# Patient Record
Sex: Female | Born: 1965 | Race: White | Hispanic: No | Marital: Married | Smoking: Former smoker
Health system: Southern US, Community
[De-identification: ages and names within clinical notes are randomized; demographics above are authoritative.]

## PROBLEM LIST (undated history)

## (undated) DIAGNOSIS — K219 Gastro-esophageal reflux disease without esophagitis: Secondary | ICD-10-CM

## (undated) DIAGNOSIS — M419 Scoliosis, unspecified: Secondary | ICD-10-CM

## (undated) DIAGNOSIS — Z8619 Personal history of other infectious and parasitic diseases: Secondary | ICD-10-CM

## (undated) DIAGNOSIS — T7840XA Allergy, unspecified, initial encounter: Secondary | ICD-10-CM

## (undated) HISTORY — DX: Scoliosis, unspecified: M41.9

## (undated) HISTORY — DX: Personal history of other infectious and parasitic diseases: Z86.19

## (undated) HISTORY — DX: Allergy, unspecified, initial encounter: T78.40XA

## (undated) HISTORY — DX: Gastro-esophageal reflux disease without esophagitis: K21.9

## (undated) HISTORY — PX: BACK SURGERY: SHX140

---

## 2003-08-14 ENCOUNTER — Ambulatory Visit (HOSPITAL_COMMUNITY): Admission: RE | Admit: 2003-08-14 | Discharge: 2003-08-14 | Payer: Self-pay | Admitting: Gastroenterology

## 2003-08-14 ENCOUNTER — Encounter: Payer: Self-pay | Admitting: Gastroenterology

## 2003-08-17 ENCOUNTER — Ambulatory Visit (HOSPITAL_COMMUNITY): Admission: RE | Admit: 2003-08-17 | Discharge: 2003-08-17 | Payer: Self-pay | Admitting: Gastroenterology

## 2003-08-17 ENCOUNTER — Encounter (INDEPENDENT_AMBULATORY_CARE_PROVIDER_SITE_OTHER): Payer: Self-pay | Admitting: Specialist

## 2010-01-21 ENCOUNTER — Emergency Department: Payer: Self-pay | Admitting: Emergency Medicine

## 2010-02-07 ENCOUNTER — Encounter: Admission: RE | Admit: 2010-02-07 | Discharge: 2010-02-07 | Payer: Self-pay | Admitting: Gastroenterology

## 2010-08-15 ENCOUNTER — Encounter: Admission: RE | Admit: 2010-08-15 | Discharge: 2010-08-15 | Payer: Self-pay | Admitting: Gastroenterology

## 2010-11-24 LAB — HM MAMMOGRAPHY

## 2011-04-11 NOTE — Op Note (Signed)
   [name], Kathryn York                            ACCOUNT NO.:  [id number]   MEDICAL RECORD NO.:  [id number]                   PATIENT TYPE:  AMB   LOCATION:  ENDO                                 FACILITY:  [facility]   PHYSICIAN:  Bernette Redbird, M.D.                DATE OF BIRTH:  [birth year]   DATE OF PROCEDURE:  08/17/2003  DATE OF DISCHARGE:                                 OPERATIVE REPORT   PROCEDURE:  Upper endoscopy.   INDICATIONS:  A 45 year old female with unexplained persistent recurring  upper abdominal pain with negative ultrasound, negative hepatobiliary scan  of gallbladder and no response to PPI therapy.   FINDINGS:  Normal exam.   CONSENT:  The nature, purpose and risks of this procedure had been discussed  with the patient who provided written consent.   SEDATION:  Fentanyl 75 mcg and Versed 6 mg IV without arrhythmias or  desaturation.   DESCRIPTION OF PROCEDURE:  The Olympus small caliber adult video endoscope  was passed under direct vision.  The vocal cords looked normal.  The  esophagus was readily entered.  The distal esophagus was normal.  There was  no free reflux, reflux esophagitis, Barrett's esophagus, varices infection,  neoplasia, or any ring, stricture or hiatal hernia appreciated.   The stomach contained no significant residual and had entirely normal mucosa  without evidence of gastritis, erosions, ulcers, polyps, or masses including  a retroflexed view of the proximal stomach.  The pylorus, duodenal bulb and  second duodenum looked normal.  In particular, there was no evidence of  peptic deformity.   Biopsies were obtained randomly from the duodenum and gastric antrum prior  to removal of the scope.  The patient tolerated the procedure well and there  were no apparent complications.   IMPRESSION:  1. Normal upper endoscopy.  2. No source of pain identified.   PLAN:  Await pathology results with followup in the office in the near   future.                                                Bernette Redbird, M.D.    RB/MEDQ  D:  08/17/2003  T:  08/17/2003  Job:  [id number]   cc:   Maurine Simmering, M.D.

## 2011-12-11 ENCOUNTER — Ambulatory Visit (INDEPENDENT_AMBULATORY_CARE_PROVIDER_SITE_OTHER): Payer: BC Managed Care – PPO | Admitting: Family Medicine

## 2011-12-11 ENCOUNTER — Encounter: Payer: Self-pay | Admitting: Family Medicine

## 2011-12-11 VITALS — BP 102/80 | HR 82 | Temp 98.4°F | Ht 64.5 in | Wt 135.0 lb

## 2011-12-11 DIAGNOSIS — Z833 Family history of diabetes mellitus: Secondary | ICD-10-CM

## 2011-12-11 DIAGNOSIS — M419 Scoliosis, unspecified: Secondary | ICD-10-CM

## 2011-12-11 DIAGNOSIS — Z8249 Family history of ischemic heart disease and other diseases of the circulatory system: Secondary | ICD-10-CM

## 2011-12-11 DIAGNOSIS — M4120 Other idiopathic scoliosis, site unspecified: Secondary | ICD-10-CM

## 2011-12-11 DIAGNOSIS — K219 Gastro-esophageal reflux disease without esophagitis: Secondary | ICD-10-CM

## 2011-12-11 NOTE — Patient Instructions (Signed)
I would think about getting a flu shot.   Don't change your meds. I would avoid ibuprofen, aleve or aspirin.  Tylenol is okay to take.   Recheck sugar and cholesterol this fall.  Come see me after that.   Keep exercising.

## 2011-12-12 ENCOUNTER — Encounter: Payer: Self-pay | Admitting: Family Medicine

## 2011-12-12 DIAGNOSIS — Z833 Family history of diabetes mellitus: Secondary | ICD-10-CM | POA: Insufficient documentation

## 2011-12-12 DIAGNOSIS — M419 Scoliosis, unspecified: Secondary | ICD-10-CM | POA: Insufficient documentation

## 2011-12-12 DIAGNOSIS — K219 Gastro-esophageal reflux disease without esophagitis: Secondary | ICD-10-CM | POA: Insufficient documentation

## 2011-12-12 NOTE — Assessment & Plan Note (Addendum)
Continue PPI, elevated head of bed. >30 min spent with face to face with patient, >50% counseling and/or coordinating care

## 2011-12-12 NOTE — Assessment & Plan Note (Signed)
Requesting records, flu shot declined.  Diet/exercise d/w pt.  Recheck sugar later in 2013.

## 2011-12-12 NOTE — Assessment & Plan Note (Signed)
Continue PT exercises at home

## 2011-12-12 NOTE — Progress Notes (Signed)
New patient, to est care.   GERD with LPR sx (pharyngeal irritation) s/p EGD per [facility].  On PPI.  D/w pt about head of bed elevation, hasn't done that yet.  No vomiting, no hematochezia.  Chest pain prev improved with PPI.    Scoliosis.  Prev in PT.  Back pain improved with that.  No h/o surgery.    FH DM.  Prev with one "borderline" sugar.  Requesting records.    PMH and SH reviewed  ROS: See HPI, otherwise noncontributory.  Meds, vitals, and allergies reviewed.   GEN: nad, alert and oriented HEENT: mucous membranes moist NECK: supple w/o LA CV: rrr. PULM: ctab, no inc wob ABD: soft, +bs EXT: no edema SKIN: no acute rash Back with scoliotic changes noted

## 2011-12-29 ENCOUNTER — Encounter: Payer: Self-pay | Admitting: Family Medicine

## 2011-12-29 DIAGNOSIS — Z8659 Personal history of other mental and behavioral disorders: Secondary | ICD-10-CM | POA: Insufficient documentation

## 2013-08-10 ENCOUNTER — Encounter (INDEPENDENT_AMBULATORY_CARE_PROVIDER_SITE_OTHER): Payer: BC Managed Care – PPO | Admitting: Ophthalmology

## 2013-08-10 DIAGNOSIS — H2510 Age-related nuclear cataract, unspecified eye: Secondary | ICD-10-CM

## 2013-08-10 DIAGNOSIS — H43819 Vitreous degeneration, unspecified eye: Secondary | ICD-10-CM

## 2013-08-10 DIAGNOSIS — H3530 Unspecified macular degeneration: Secondary | ICD-10-CM

## 2013-08-24 ENCOUNTER — Ambulatory Visit (INDEPENDENT_AMBULATORY_CARE_PROVIDER_SITE_OTHER)
Admission: RE | Admit: 2013-08-24 | Discharge: 2013-08-24 | Disposition: A | Discharge status: Home / Self Care | Payer: BC Managed Care – PPO | Source: Ambulatory Visit | Attending: Family Medicine | Admitting: Family Medicine

## 2013-08-24 ENCOUNTER — Encounter: Payer: Self-pay | Admitting: Family Medicine

## 2013-08-24 ENCOUNTER — Ambulatory Visit (INDEPENDENT_AMBULATORY_CARE_PROVIDER_SITE_OTHER): Payer: BC Managed Care – PPO | Admitting: Family Medicine

## 2013-08-24 VITALS — BP 112/70 | HR 76 | Temp 97.8°F | Wt 134.2 lb

## 2013-08-24 DIAGNOSIS — M419 Scoliosis, unspecified: Secondary | ICD-10-CM

## 2013-08-24 DIAGNOSIS — M4120 Other idiopathic scoliosis, site unspecified: Secondary | ICD-10-CM

## 2013-08-24 DIAGNOSIS — Z01818 Encounter for other preprocedural examination: Secondary | ICD-10-CM

## 2013-08-24 DIAGNOSIS — M549 Dorsalgia, unspecified: Secondary | ICD-10-CM

## 2013-08-24 NOTE — Assessment & Plan Note (Signed)
EKG w/o changes.  Normal exercise tolerance per patient w/o CP.  Assuming CXR and labs wnl, then no contraindication to surgery and it would appear that benefit would outweigh the risk of surgery.  I told patient that while I cannot "clear" a patient for surgery, then it would appear that she is appropriately low risk for surgery, assuming her labs etc are wnl.   >25 min spent with face to face with patient, >50% counseling and/or coordinating care.

## 2013-08-24 NOTE — Patient Instructions (Addendum)
Go to the lab on the way out.  We'll contact you with your lab and xray report. Take care.  Glad to see you.   

## 2013-08-24 NOTE — Progress Notes (Signed)
H/o scoliosis and surgery planned.  Here for pre op eval.   No h/o CVA TIA MI CABG stent.  No h/o CP. Good exercise tolerance.  Not SOB, no BLE edema. No h/o HTN CAD DM2.  Feels well except for pain from scoliosis.   Benefits and risks of surgery d/w pt.    PMH and SH reviewed  ROS: See HPI, otherwise noncontributory.  Meds, vitals, and allergies reviewed.   GEN: nad, alert and oriented HEENT: mucous membranes moist NECK: supple w/o LA CV: rrr.  no murmur PULM: ctab, no inc wob ABD: soft, +bs EXT: no edema SKIN: no acute rash Back with scoliotic changes noted

## 2013-08-25 LAB — URINALYSIS, ROUTINE W REFLEX MICROSCOPIC
Leukocytes, UA: NEGATIVE
Specific Gravity, Urine: 1.01 (ref 1.000–1.030)
Urobilinogen, UA: 0.2 (ref 0.0–1.0)
WBC, UA: NONE SEEN (ref 0–?)

## 2013-08-25 LAB — CBC WITH DIFFERENTIAL/PLATELET
Eosinophils Absolute: 0.1 10*3/uL (ref 0.0–0.7)
Eosinophils Relative: 2 % (ref 0.0–5.0)
MCHC: 33.6 g/dL (ref 30.0–36.0)
MCV: 86 fl (ref 78.0–100.0)
Monocytes Absolute: 0.5 10*3/uL (ref 0.1–1.0)
Neutrophils Relative %: 59.1 % (ref 43.0–77.0)
Platelets: 297 10*3/uL (ref 150.0–400.0)
WBC: 7.1 10*3/uL (ref 4.5–10.5)

## 2013-08-25 LAB — COMPREHENSIVE METABOLIC PANEL
Albumin: 4.5 g/dL (ref 3.5–5.2)
Alkaline Phosphatase: 62 U/L (ref 39–117)
Calcium: 9.6 mg/dL (ref 8.4–10.5)
Chloride: 105 mEq/L (ref 96–112)
Glucose, Bld: 78 mg/dL (ref 70–99)
Potassium: 3.7 mEq/L (ref 3.5–5.1)
Sodium: 139 mEq/L (ref 135–145)
Total Protein: 7.2 g/dL (ref 6.0–8.3)

## 2013-08-25 LAB — PROTIME-INR
INR: 1 ratio (ref 0.8–1.0)
Prothrombin Time: 10.1 s — ABNORMAL LOW (ref 10.2–12.4)

## 2013-08-25 LAB — VITAMIN D 25 HYDROXY (VIT D DEFICIENCY, FRACTURES): Vit D, 25-Hydroxy: 54 ng/mL (ref 30–89)

## 2013-08-30 ENCOUNTER — Telehealth: Payer: Self-pay

## 2013-08-30 NOTE — Telephone Encounter (Signed)
Pt concerned about back surgery coming up soon and pt is feeling very anxious during the day and pt can go to sleep but wakes up during the night and cannot return to sleep for awhile. Pt request mild med for anxiety. Pt wants to know whatever med prescribed, can pt also take benadryl at bedtime to help pt sleep. Pt said 2 years ago was prescribed anxiety med that caused pt's BP to drop and pt felt very bad. Pt does not know name of med. [facility] does not have med for anxiety from 2 years ago. Pt said she will call previous dr to get name of anxiety med that caused BP to drop and will call back with info.

## 2013-08-30 NOTE — Telephone Encounter (Signed)
Pt said amitriptyline 25 mg was med that caused BP to drop and pt felt very weak. Pt did not lose consciousness. Pt request cb after med refilled.(Amitriptyline added to adverse reaction list on pts chart).

## 2013-08-31 NOTE — Telephone Encounter (Signed)
I would try taking OTC melatonin with 25-50mg  benadryl at night and see if that helps.  If not, then let me know.  Thanks.

## 2013-09-01 NOTE — Telephone Encounter (Signed)
Patient advised.

## 2013-09-29 ENCOUNTER — Other Ambulatory Visit: Payer: Self-pay

## 2014-01-23 ENCOUNTER — Other Ambulatory Visit: Payer: Self-pay | Admitting: Internal Medicine

## 2014-08-14 ENCOUNTER — Ambulatory Visit (INDEPENDENT_AMBULATORY_CARE_PROVIDER_SITE_OTHER): Payer: BC Managed Care – PPO | Admitting: Ophthalmology

## 2014-08-14 DIAGNOSIS — H3530 Unspecified macular degeneration: Secondary | ICD-10-CM

## 2014-08-14 DIAGNOSIS — H43819 Vitreous degeneration, unspecified eye: Secondary | ICD-10-CM

## 2014-08-14 DIAGNOSIS — H2510 Age-related nuclear cataract, unspecified eye: Secondary | ICD-10-CM

## 2014-08-14 DIAGNOSIS — D3130 Benign neoplasm of unspecified choroid: Secondary | ICD-10-CM

## 2015-01-29 ENCOUNTER — Other Ambulatory Visit: Payer: Self-pay | Admitting: Internal Medicine

## 2015-01-29 DIAGNOSIS — R4702 Dysphasia: Secondary | ICD-10-CM

## 2015-01-29 DIAGNOSIS — K219 Gastro-esophageal reflux disease without esophagitis: Secondary | ICD-10-CM

## 2015-02-01 ENCOUNTER — Other Ambulatory Visit: Payer: Self-pay

## 2015-02-05 ENCOUNTER — Other Ambulatory Visit: Payer: Self-pay

## 2015-02-08 ENCOUNTER — Ambulatory Visit
Admission: RE | Admit: 2015-02-08 | Discharge: 2015-02-08 | Disposition: A | Discharge status: Home / Self Care | Payer: BLUE CROSS/BLUE SHIELD | Source: Ambulatory Visit | Attending: Internal Medicine | Admitting: Internal Medicine

## 2015-02-08 DIAGNOSIS — R4702 Dysphasia: Secondary | ICD-10-CM

## 2015-02-08 DIAGNOSIS — K219 Gastro-esophageal reflux disease without esophagitis: Secondary | ICD-10-CM

## 2015-02-22 ENCOUNTER — Other Ambulatory Visit: Payer: Self-pay | Admitting: Gastroenterology

## 2015-02-22 DIAGNOSIS — K219 Gastro-esophageal reflux disease without esophagitis: Secondary | ICD-10-CM

## 2015-02-22 DIAGNOSIS — R1310 Dysphagia, unspecified: Secondary | ICD-10-CM

## 2015-03-01 ENCOUNTER — Ambulatory Visit (HOSPITAL_COMMUNITY)
Admission: RE | Admit: 2015-03-01 | Discharge: 2015-03-01 | Disposition: A | Discharge status: Home / Self Care | Payer: BLUE CROSS/BLUE SHIELD | Source: Ambulatory Visit | Attending: Gastroenterology | Admitting: Gastroenterology

## 2015-03-01 ENCOUNTER — Encounter (HOSPITAL_COMMUNITY): Payer: Self-pay

## 2015-03-01 DIAGNOSIS — R1900 Intra-abdominal and pelvic swelling, mass and lump, unspecified site: Secondary | ICD-10-CM | POA: Diagnosis present

## 2015-03-01 DIAGNOSIS — K439 Ventral hernia without obstruction or gangrene: Secondary | ICD-10-CM | POA: Insufficient documentation

## 2015-03-01 DIAGNOSIS — R1310 Dysphagia, unspecified: Secondary | ICD-10-CM | POA: Diagnosis not present

## 2015-03-01 DIAGNOSIS — K219 Gastro-esophageal reflux disease without esophagitis: Secondary | ICD-10-CM | POA: Insufficient documentation

## 2015-03-01 DIAGNOSIS — R1010 Upper abdominal pain, unspecified: Secondary | ICD-10-CM | POA: Insufficient documentation

## 2015-03-01 MED ORDER — IOHEXOL 300 MG/ML  SOLN
80.0000 mL | Freq: Once | INTRAMUSCULAR | Status: AC | PRN
  Administered 2015-03-01: 80 mL via INTRAVENOUS

## 2015-08-15 ENCOUNTER — Ambulatory Visit (INDEPENDENT_AMBULATORY_CARE_PROVIDER_SITE_OTHER): Payer: BC Managed Care – PPO | Admitting: Ophthalmology

## 2015-08-31 ENCOUNTER — Ambulatory Visit (INDEPENDENT_AMBULATORY_CARE_PROVIDER_SITE_OTHER): Payer: BLUE CROSS/BLUE SHIELD | Admitting: Ophthalmology

## 2015-08-31 DIAGNOSIS — D3132 Benign neoplasm of left choroid: Secondary | ICD-10-CM | POA: Diagnosis not present

## 2015-08-31 DIAGNOSIS — H353131 Nonexudative age-related macular degeneration, bilateral, early dry stage: Secondary | ICD-10-CM

## 2015-08-31 DIAGNOSIS — H43813 Vitreous degeneration, bilateral: Secondary | ICD-10-CM | POA: Diagnosis not present

## 2016-08-07 DIAGNOSIS — Z1231 Encounter for screening mammogram for malignant neoplasm of breast: Secondary | ICD-10-CM | POA: Diagnosis not present

## 2016-08-11 DIAGNOSIS — F418 Other specified anxiety disorders: Secondary | ICD-10-CM | POA: Diagnosis not present

## 2016-08-11 DIAGNOSIS — E559 Vitamin D deficiency, unspecified: Secondary | ICD-10-CM | POA: Diagnosis not present

## 2016-08-11 DIAGNOSIS — M542 Cervicalgia: Secondary | ICD-10-CM | POA: Diagnosis not present

## 2016-08-11 DIAGNOSIS — M545 Low back pain: Secondary | ICD-10-CM | POA: Diagnosis not present

## 2016-08-11 DIAGNOSIS — R5383 Other fatigue: Secondary | ICD-10-CM | POA: Diagnosis not present

## 2016-09-02 ENCOUNTER — Ambulatory Visit (INDEPENDENT_AMBULATORY_CARE_PROVIDER_SITE_OTHER): Payer: BLUE CROSS/BLUE SHIELD | Admitting: Ophthalmology

## 2016-09-02 DIAGNOSIS — H2513 Age-related nuclear cataract, bilateral: Secondary | ICD-10-CM

## 2016-09-02 DIAGNOSIS — H43813 Vitreous degeneration, bilateral: Secondary | ICD-10-CM | POA: Diagnosis not present

## 2016-09-02 DIAGNOSIS — D3132 Benign neoplasm of left choroid: Secondary | ICD-10-CM | POA: Diagnosis not present

## 2016-09-02 DIAGNOSIS — H353131 Nonexudative age-related macular degeneration, bilateral, early dry stage: Secondary | ICD-10-CM | POA: Diagnosis not present

## 2016-09-16 DIAGNOSIS — H25013 Cortical age-related cataract, bilateral: Secondary | ICD-10-CM | POA: Diagnosis not present

## 2016-09-16 DIAGNOSIS — H353130 Nonexudative age-related macular degeneration, bilateral, stage unspecified: Secondary | ICD-10-CM | POA: Diagnosis not present

## 2016-09-16 DIAGNOSIS — H2513 Age-related nuclear cataract, bilateral: Secondary | ICD-10-CM | POA: Diagnosis not present

## 2016-09-16 DIAGNOSIS — H353120 Nonexudative age-related macular degeneration, left eye, stage unspecified: Secondary | ICD-10-CM | POA: Diagnosis not present

## 2016-09-16 DIAGNOSIS — H353110 Nonexudative age-related macular degeneration, right eye, stage unspecified: Secondary | ICD-10-CM | POA: Diagnosis not present

## 2016-09-16 DIAGNOSIS — H35031 Hypertensive retinopathy, right eye: Secondary | ICD-10-CM | POA: Diagnosis not present

## 2016-09-16 DIAGNOSIS — H35032 Hypertensive retinopathy, left eye: Secondary | ICD-10-CM | POA: Diagnosis not present

## 2016-10-07 DIAGNOSIS — M542 Cervicalgia: Secondary | ICD-10-CM | POA: Diagnosis not present

## 2016-10-07 DIAGNOSIS — Z09 Encounter for follow-up examination after completed treatment for conditions other than malignant neoplasm: Secondary | ICD-10-CM | POA: Diagnosis not present

## 2016-10-15 ENCOUNTER — Other Ambulatory Visit: Payer: Self-pay | Admitting: Orthopedic Surgery

## 2016-10-15 DIAGNOSIS — M419 Scoliosis, unspecified: Secondary | ICD-10-CM

## 2016-10-23 DIAGNOSIS — K219 Gastro-esophageal reflux disease without esophagitis: Secondary | ICD-10-CM | POA: Diagnosis not present

## 2016-10-23 DIAGNOSIS — Z1211 Encounter for screening for malignant neoplasm of colon: Secondary | ICD-10-CM | POA: Diagnosis not present

## 2016-10-23 DIAGNOSIS — Z01818 Encounter for other preprocedural examination: Secondary | ICD-10-CM | POA: Diagnosis not present

## 2016-10-23 DIAGNOSIS — R143 Flatulence: Secondary | ICD-10-CM | POA: Diagnosis not present

## 2016-10-26 ENCOUNTER — Other Ambulatory Visit: Payer: BLUE CROSS/BLUE SHIELD

## 2016-10-30 DIAGNOSIS — M62838 Other muscle spasm: Secondary | ICD-10-CM | POA: Diagnosis not present

## 2016-11-06 DIAGNOSIS — M62838 Other muscle spasm: Secondary | ICD-10-CM | POA: Diagnosis not present

## 2016-11-10 DIAGNOSIS — M62838 Other muscle spasm: Secondary | ICD-10-CM | POA: Diagnosis not present

## 2016-11-11 DIAGNOSIS — M542 Cervicalgia: Secondary | ICD-10-CM | POA: Diagnosis not present

## 2016-11-11 DIAGNOSIS — R5383 Other fatigue: Secondary | ICD-10-CM | POA: Diagnosis not present

## 2016-11-11 DIAGNOSIS — M545 Low back pain: Secondary | ICD-10-CM | POA: Diagnosis not present

## 2016-11-11 DIAGNOSIS — R05 Cough: Secondary | ICD-10-CM | POA: Diagnosis not present

## 2016-11-13 DIAGNOSIS — M4125 Other idiopathic scoliosis, thoracolumbar region: Secondary | ICD-10-CM | POA: Diagnosis not present

## 2016-11-20 DIAGNOSIS — M62838 Other muscle spasm: Secondary | ICD-10-CM | POA: Diagnosis not present

## 2016-11-28 DIAGNOSIS — M62838 Other muscle spasm: Secondary | ICD-10-CM | POA: Diagnosis not present

## 2016-12-08 DIAGNOSIS — R509 Fever, unspecified: Secondary | ICD-10-CM | POA: Diagnosis not present

## 2016-12-08 DIAGNOSIS — R05 Cough: Secondary | ICD-10-CM | POA: Diagnosis not present

## 2016-12-08 DIAGNOSIS — Z6821 Body mass index (BMI) 21.0-21.9, adult: Secondary | ICD-10-CM | POA: Diagnosis not present

## 2016-12-08 DIAGNOSIS — J111 Influenza due to unidentified influenza virus with other respiratory manifestations: Secondary | ICD-10-CM | POA: Diagnosis not present

## 2016-12-10 ENCOUNTER — Emergency Department
Admission: EM | Admit: 2016-12-10 | Discharge: 2016-12-11 | Disposition: A | Discharge status: Home / Self Care | Payer: BLUE CROSS/BLUE SHIELD | Attending: Emergency Medicine | Admitting: Emergency Medicine

## 2016-12-10 ENCOUNTER — Encounter: Payer: Self-pay | Admitting: Emergency Medicine

## 2016-12-10 ENCOUNTER — Emergency Department: Payer: BLUE CROSS/BLUE SHIELD

## 2016-12-10 DIAGNOSIS — Z79899 Other long term (current) drug therapy: Secondary | ICD-10-CM | POA: Insufficient documentation

## 2016-12-10 DIAGNOSIS — R05 Cough: Secondary | ICD-10-CM | POA: Diagnosis not present

## 2016-12-10 DIAGNOSIS — R5381 Other malaise: Secondary | ICD-10-CM | POA: Diagnosis not present

## 2016-12-10 DIAGNOSIS — R0981 Nasal congestion: Secondary | ICD-10-CM | POA: Insufficient documentation

## 2016-12-10 DIAGNOSIS — Z87891 Personal history of nicotine dependence: Secondary | ICD-10-CM | POA: Diagnosis not present

## 2016-12-10 DIAGNOSIS — J3489 Other specified disorders of nose and nasal sinuses: Secondary | ICD-10-CM | POA: Insufficient documentation

## 2016-12-10 DIAGNOSIS — M791 Myalgia: Secondary | ICD-10-CM | POA: Insufficient documentation

## 2016-12-10 DIAGNOSIS — R531 Weakness: Secondary | ICD-10-CM | POA: Diagnosis not present

## 2016-12-10 DIAGNOSIS — R059 Cough, unspecified: Secondary | ICD-10-CM

## 2016-12-10 LAB — URINALYSIS, COMPLETE (UACMP) WITH MICROSCOPIC
BACTERIA UA: NONE SEEN
Bilirubin Urine: NEGATIVE
Glucose, UA: NEGATIVE mg/dL
Hgb urine dipstick: NEGATIVE
Ketones, ur: NEGATIVE mg/dL
Leukocytes, UA: NEGATIVE
Nitrite: NEGATIVE
PH: 6 (ref 5.0–8.0)
Protein, ur: NEGATIVE mg/dL
SQUAMOUS EPITHELIAL / LPF: NONE SEEN
Specific Gravity, Urine: 1.011 (ref 1.005–1.030)

## 2016-12-10 LAB — BASIC METABOLIC PANEL
Anion gap: 10 (ref 5–15)
BUN: 22 mg/dL — AB (ref 6–20)
CHLORIDE: 103 mmol/L (ref 101–111)
CO2: 26 mmol/L (ref 22–32)
CREATININE: 0.87 mg/dL (ref 0.44–1.00)
Calcium: 9.4 mg/dL (ref 8.9–10.3)
GFR calc Af Amer: >60 mL/min (ref >60)
Glucose, Bld: 109 mg/dL — ABNORMAL HIGH (ref 65–99)
POTASSIUM: 3.9 mmol/L (ref 3.5–5.1)
Sodium: 139 mmol/L (ref 135–145)

## 2016-12-10 LAB — MONONUCLEOSIS SCREEN: MONO SCREEN: NEGATIVE

## 2016-12-10 LAB — CBC
HEMATOCRIT: 41.3 % (ref 35.0–47.0)
Hemoglobin: 14.3 g/dL (ref 12.0–16.0)
MCH: 28.6 pg (ref 26.0–34.0)
MCHC: 34.6 g/dL (ref 32.0–36.0)
MCV: 82.7 fL (ref 80.0–100.0)
Platelets: 328 10*3/uL (ref 150–440)
RBC: 5 MIL/uL (ref 3.80–5.20)
RDW: 13.5 % (ref 11.5–14.5)
WBC: 8.5 10*3/uL (ref 3.6–11.0)

## 2016-12-10 LAB — INFLUENZA PANEL BY PCR (TYPE A & B)
INFLAPCR: NEGATIVE
Influenza B By PCR: NEGATIVE

## 2016-12-10 LAB — TSH: TSH: 0.863 u[IU]/mL (ref 0.350–4.500)

## 2016-12-10 LAB — TROPONIN I: Troponin I: <0.03 ng/mL (ref <0.03)

## 2016-12-10 MED ORDER — BENZONATATE 100 MG PO CAPS: 100.0000 mg | ORAL_CAPSULE | Freq: Four times a day (QID) | ORAL | 0 refills | Status: AC | PRN

## 2016-12-10 MED ORDER — SODIUM CHLORIDE 0.9 % IV BOLUS (SEPSIS)
1000.0000 mL | Freq: Once | INTRAVENOUS | Status: AC
  Administered 2016-12-10: 1000 mL via INTRAVENOUS

## 2016-12-10 MED ORDER — BENZONATATE 100 MG PO CAPS
100.0000 mg | ORAL_CAPSULE | Freq: Once | ORAL | Status: AC
  Administered 2016-12-10: 100 mg via ORAL
  Filled 2016-12-10: qty 1

## 2016-12-10 NOTE — Discharge Instructions (Signed)
Please complete the Azithromycin and Prednisone prescriptions until they have been completed, even if you're feeling better.  Return to the emergency department if you develop shortness of breath, fever, lightheadedness or fainting, or any other symptoms concerning to you.

## 2016-12-10 NOTE — ED Provider Notes (Signed)
[facility] Emergency Department Provider Note  ____________________________________________  Time seen: Approximately 10:08 PM  I have reviewed the triage vital signs and the nursing notes.   HISTORY  Chief Complaint Weakness    HPI Kathryn York is a 51 y.o. female with a history of GERD, seasonal allergies, presenting with cough and generalized weakness. The patient reports that since early December she has had several intermittent coughs. Initially in December, she tested negative for influenza but was treated with Tamiflu and felt better for several weeks. For the last week, she has had a dry cough with congestion and rhinorrhea but no sore throat or ear pain, no fevers or chills. No associated nausea, vomiting or diarrhea, urinary symptoms. She reports that even when she did not have cough, she has had generalized weakness since early December. She denies any hair loss, weight loss. 3 days ago, she was seen by her primary care physician for ongoing cough with negative flu swab and question of all findings on her chest x-ray, so she was initiated on prednisone and azithromycin. Today, she felt so weak that she was unable to stand up on her own.   Past Medical History:  Diagnosis Date  . Allergy   . GERD (gastroesophageal reflux disease)    with LRP sx, s/p EGD by [facility]  . History of chicken pox   . Scoliosis     Patient Active Problem List   Diagnosis Date Noted  . H/O: depression 12/29/2011  . GERD (gastroesophageal reflux disease) 12/12/2011  . Scoliosis 12/12/2011  . FH: diabetes mellitus 12/12/2011    Past Surgical History:  Procedure Laterality Date  . BACK SURGERY      Current Outpatient Rx  . Order #: [id number] Class: Historical Med  . Order #: [id number] Class: Historical Med  . Order #: [id number] Class: Historical Med  . Order #: [id number] Class: Historical Med  . Order #: [id number] Class: Historical Med  . Order #:  [id number] Class: Print    Allergies Amitriptyline  Family History  Problem Relation Age of Onset  . Cancer Other     Breast, prostate  . Hyperlipidemia Other   . Depression Other   . Hypertension Mother   . Diabetes Mother   . Cancer Father     Prostate  . Hyperlipidemia Father     Social History Social History  Substance Use Topics  . Smoking status: Former Smoker    Packs/day: 0.80    Years: 15.00    Types: Cigarettes    Quit date: 11/24/1993  . Smokeless tobacco: Never Used  . Alcohol use Yes     Comment: occasionally    Review of Systems Constitutional: No fever/chills.Positive general malaise and myalgias. Positive generalized weakness and inability to stand due to weakness. Eyes: No visual changes. No eye discharge. ENT: No sore throat. Positive congestion and rhinorrhea. Cardiovascular: Denies chest pain. Denies palpitations. Respiratory: Denies shortness of breath.  Positive dry cough. Gastrointestinal: No abdominal pain.  No nausea, no vomiting.  No diarrhea.  No constipation. Genitourinary: Negative for dysuria. Musculoskeletal: Negative for back pain. Skin: Negative for rash. Neurological: Negative for headaches. No focal numbness, tingling or weakness.   10-point ROS otherwise negative.  ____________________________________________   PHYSICAL EXAM:  VITAL SIGNS: ED Triage Vitals [12/10/16 2009]  Enc Vitals Group     BP 110/62     Pulse Rate 97     Resp 20     Temp 98.3 F (36.8 C)     Temp Source  Oral     SpO2 98 %     Weight 130 lb (59 kg)     Height 5\' 6"  (1.676 m)     Head Circumference      Peak Flow      Pain Score      Pain Loc      Pain Edu?      Excl. in [place]?     Constitutional: Alert and oriented. Uncomfortable appearing but in no acute distress. Answers questions appropriately. Eyes: Conjunctivae are normal.  EOMI. No scleral icterus. No eye discharge. Head: Atraumatic. Nose: No congestion/rhinnorhea. Mouth/Throat: Mucous  membranes are moist.  Neck: No stridor.  Supple.  No meningismus. Cardiovascular: Normal rate, regular rhythm. No murmurs, rubs or gallops.  Respiratory: Normal respiratory effort.  No accessory muscle use or retractions. Lungs CTAB.  No wheezes, rales or ronchi. Occasional cough during the examination that is dry. Gastrointestinal: Soft, nontender and nondistended.  No guarding or rebound.  No peritoneal signs. Musculoskeletal: No LE edema. No ttp in the calves or palpable cords.  Negative Homan's sign. Neurologic:  A&Ox3.  Speech is clear.  Face and smile are symmetric.  EOMI.  Moves all extremities well. Skin:  Skin is warm, dry and intact. No rash noted. Psychiatric: Depressed mood and flat affect.Marland Kitchen Speech and behavior are normal.  Normal judgement.  ____________________________________________   LABS (all labs ordered are listed, but only abnormal results are displayed)  Labs Reviewed  BASIC METABOLIC PANEL - Abnormal; Notable for the following:       Result Value   Glucose, Bld 109 (*)    BUN 22 (*)    All other components within normal limits  URINALYSIS, COMPLETE (UACMP) WITH MICROSCOPIC - Abnormal; Notable for the following:    Color, Urine STRAW (*)    APPearance CLEAR (*)    All other components within normal limits  CBC  TROPONIN I  TSH  MONONUCLEOSIS SCREEN  INFLUENZA PANEL BY PCR (TYPE A & B)  CBG MONITORING, ED   ____________________________________________  EKG  ED ECG REPORT I, Eula Listen, the attending physician, personally viewed and interpreted this ECG.   Date: 12/10/2016  EKG Time: 2007  Rate: 99  Rhythm: normal sinus rhythm  Axis: normal  Intervals:none  ST&T Change: No STEMI  ____________________________________________  RADIOLOGY  Dg Chest 2 View  Result Date: 12/10/2016 CLINICAL DATA:  Weakness. Recent diagnosis of flu and possible pneumonia. EXAM: CHEST  2 VIEW COMPARISON:  Chest x-ray dated 08/24/2013. FINDINGS: Heart size  and mediastinal contours are stable. Lungs are clear. No pleural effusion or pneumothorax seen. Interval placement of fixation rods within the thoracic spine with associated decrease in scoliotic deformity. No acute or suspicious osseous finding. IMPRESSION: No active cardiopulmonary disease. No evidence of pneumonia or pulmonary edema. Electronically Signed   By: Franki Cabot M.D.   On: 12/10/2016 20:39    ____________________________________________   PROCEDURES  Procedure(s) performed: None  Procedures  Critical Care performed: No ____________________________________________   INITIAL IMPRESSION / ASSESSMENT AND PLAN / ED COURSE  Pertinent labs & imaging results that were available during my care of the patient were reviewed by me and considered in my medical decision making (see chart for details).  51 y.o. female presenting for cough and generalized weakness. Overall, the patient has reassuring vital signs and a reassuring examination. It is possible that she has a viral URI, but we will also evaluate her for pneumonia or influenza. The patient has had weakness with intermittent  coughing illnesses, and may have some other underlying cause for her weakness including UTI, hypothyroidism, mononucleosis, electrolyte disturbance, or even acute exacerbation of her depression. I will treat the patient for her cough, and reevaluate her for final disposition.  ----------------------------------------- 12:17 AM on 12/11/2016 -----------------------------------------  The patient's workup has been reassuring in the emergency department. She has continued to remain hemodynamically stable. Her chest x-ray does not show pneumonia, her influenza testing is negative, her mono screen is negative. She does not have an abnormal TSH. Her urinalysis is also negative for infection. Her electrolytes are within normal limits. At this time, the patient is safe for discharge. I have had a long discussion with  the patient and her husband about my findings and the follow-up plan. [place] return precautions as well.  ____________________________________________  FINAL CLINICAL IMPRESSION(S) / ED DIAGNOSES  Final diagnoses:  Cough  Generalized weakness    Clinical Course       NEW MEDICATIONS STARTED DURING THIS VISIT:  New Prescriptions   BENZONATATE (TESSALON PERLES) 100 MG CAPSULE    Take 1 capsule (100 mg total) by mouth every 6 (six) hours as needed for cough.      Eula Listen, MD 12/11/16 [phone]

## 2016-12-10 NOTE — ED Triage Notes (Signed)
Pt presents to ED via wheelchair, c/o weakness. Pt was seen 2 days by PCP dx with flu and possible pneumonia. Per spouse pt reported "heart was beating hard." Spouse reports for pt began to feel weak 30-40 min PTA. Pt denies nausea, vomiting, diarrhea, or chest pain.

## 2016-12-18 DIAGNOSIS — K5909 Other constipation: Secondary | ICD-10-CM | POA: Diagnosis not present

## 2016-12-18 DIAGNOSIS — M545 Low back pain: Secondary | ICD-10-CM | POA: Diagnosis not present

## 2016-12-18 DIAGNOSIS — I872 Venous insufficiency (chronic) (peripheral): Secondary | ICD-10-CM | POA: Diagnosis not present

## 2016-12-18 DIAGNOSIS — R42 Dizziness and giddiness: Secondary | ICD-10-CM | POA: Diagnosis not present

## 2016-12-22 DIAGNOSIS — M62838 Other muscle spasm: Secondary | ICD-10-CM | POA: Diagnosis not present

## 2016-12-29 DIAGNOSIS — M62838 Other muscle spasm: Secondary | ICD-10-CM | POA: Diagnosis not present

## 2017-01-08 DIAGNOSIS — M62838 Other muscle spasm: Secondary | ICD-10-CM | POA: Diagnosis not present

## 2017-01-15 DIAGNOSIS — M62838 Other muscle spasm: Secondary | ICD-10-CM | POA: Diagnosis not present

## 2017-01-19 DIAGNOSIS — M62838 Other muscle spasm: Secondary | ICD-10-CM | POA: Diagnosis not present

## 2017-01-29 DIAGNOSIS — M62838 Other muscle spasm: Secondary | ICD-10-CM | POA: Diagnosis not present

## 2017-02-05 DIAGNOSIS — M62838 Other muscle spasm: Secondary | ICD-10-CM | POA: Diagnosis not present

## 2017-02-16 DIAGNOSIS — E559 Vitamin D deficiency, unspecified: Secondary | ICD-10-CM | POA: Diagnosis not present

## 2017-02-16 DIAGNOSIS — Z0000 Encounter for general adult medical examination without abnormal findings: Secondary | ICD-10-CM | POA: Diagnosis not present

## 2017-02-16 DIAGNOSIS — M62838 Other muscle spasm: Secondary | ICD-10-CM | POA: Diagnosis not present

## 2017-02-23 DIAGNOSIS — Z0000 Encounter for general adult medical examination without abnormal findings: Secondary | ICD-10-CM | POA: Diagnosis not present

## 2017-02-23 DIAGNOSIS — Z6821 Body mass index (BMI) 21.0-21.9, adult: Secondary | ICD-10-CM | POA: Diagnosis not present

## 2017-02-23 DIAGNOSIS — Z1389 Encounter for screening for other disorder: Secondary | ICD-10-CM | POA: Diagnosis not present

## 2017-04-07 DIAGNOSIS — R1906 Epigastric swelling, mass or lump: Secondary | ICD-10-CM | POA: Diagnosis not present

## 2017-04-07 DIAGNOSIS — Z6821 Body mass index (BMI) 21.0-21.9, adult: Secondary | ICD-10-CM | POA: Diagnosis not present

## 2017-04-07 DIAGNOSIS — R1013 Epigastric pain: Secondary | ICD-10-CM | POA: Diagnosis not present

## 2017-04-07 DIAGNOSIS — Z0000 Encounter for general adult medical examination without abnormal findings: Secondary | ICD-10-CM | POA: Diagnosis not present

## 2017-04-07 DIAGNOSIS — R6882 Decreased libido: Secondary | ICD-10-CM | POA: Diagnosis not present

## 2017-04-07 DIAGNOSIS — K219 Gastro-esophageal reflux disease without esophagitis: Secondary | ICD-10-CM | POA: Diagnosis not present

## 2017-05-07 DIAGNOSIS — D128 Benign neoplasm of rectum: Secondary | ICD-10-CM | POA: Diagnosis not present

## 2017-05-07 DIAGNOSIS — K635 Polyp of colon: Secondary | ICD-10-CM | POA: Diagnosis not present

## 2017-05-07 DIAGNOSIS — D122 Benign neoplasm of ascending colon: Secondary | ICD-10-CM | POA: Diagnosis not present

## 2017-05-07 DIAGNOSIS — D124 Benign neoplasm of descending colon: Secondary | ICD-10-CM | POA: Diagnosis not present

## 2017-05-07 DIAGNOSIS — K621 Rectal polyp: Secondary | ICD-10-CM | POA: Diagnosis not present

## 2017-05-07 DIAGNOSIS — K219 Gastro-esophageal reflux disease without esophagitis: Secondary | ICD-10-CM | POA: Diagnosis not present

## 2017-05-07 DIAGNOSIS — Z1211 Encounter for screening for malignant neoplasm of colon: Secondary | ICD-10-CM | POA: Diagnosis not present

## 2017-05-22 DIAGNOSIS — K439 Ventral hernia without obstruction or gangrene: Secondary | ICD-10-CM | POA: Diagnosis not present

## 2017-05-22 DIAGNOSIS — R1906 Epigastric swelling, mass or lump: Secondary | ICD-10-CM | POA: Diagnosis not present

## 2017-05-25 DIAGNOSIS — D2371 Other benign neoplasm of skin of right lower limb, including hip: Secondary | ICD-10-CM | POA: Diagnosis not present

## 2017-05-25 DIAGNOSIS — D2372 Other benign neoplasm of skin of left lower limb, including hip: Secondary | ICD-10-CM | POA: Diagnosis not present

## 2017-05-25 DIAGNOSIS — L298 Other pruritus: Secondary | ICD-10-CM | POA: Diagnosis not present

## 2017-05-25 DIAGNOSIS — D2272 Melanocytic nevi of left lower limb, including hip: Secondary | ICD-10-CM | POA: Diagnosis not present

## 2017-09-02 ENCOUNTER — Ambulatory Visit (INDEPENDENT_AMBULATORY_CARE_PROVIDER_SITE_OTHER): Payer: BLUE CROSS/BLUE SHIELD | Admitting: Ophthalmology

## 2017-09-14 ENCOUNTER — Encounter (INDEPENDENT_AMBULATORY_CARE_PROVIDER_SITE_OTHER): Payer: BLUE CROSS/BLUE SHIELD | Admitting: Ophthalmology

## 2017-09-21 ENCOUNTER — Encounter (INDEPENDENT_AMBULATORY_CARE_PROVIDER_SITE_OTHER): Payer: BLUE CROSS/BLUE SHIELD | Admitting: Ophthalmology

## 2017-09-21 DIAGNOSIS — H353121 Nonexudative age-related macular degeneration, left eye, early dry stage: Secondary | ICD-10-CM

## 2017-09-21 DIAGNOSIS — L659 Nonscarring hair loss, unspecified: Secondary | ICD-10-CM | POA: Diagnosis not present

## 2017-09-21 DIAGNOSIS — D3132 Benign neoplasm of left choroid: Secondary | ICD-10-CM

## 2017-09-21 DIAGNOSIS — H43813 Vitreous degeneration, bilateral: Secondary | ICD-10-CM | POA: Diagnosis not present

## 2017-09-21 DIAGNOSIS — H2513 Age-related nuclear cataract, bilateral: Secondary | ICD-10-CM

## 2017-09-21 DIAGNOSIS — M859 Disorder of bone density and structure, unspecified: Secondary | ICD-10-CM | POA: Diagnosis not present

## 2017-09-21 DIAGNOSIS — Z6822 Body mass index (BMI) 22.0-22.9, adult: Secondary | ICD-10-CM | POA: Diagnosis not present

## 2017-09-21 DIAGNOSIS — R5383 Other fatigue: Secondary | ICD-10-CM | POA: Diagnosis not present

## 2017-09-21 DIAGNOSIS — H353112 Nonexudative age-related macular degeneration, right eye, intermediate dry stage: Secondary | ICD-10-CM | POA: Diagnosis not present

## 2017-09-21 DIAGNOSIS — F419 Anxiety disorder, unspecified: Secondary | ICD-10-CM | POA: Diagnosis not present

## 2017-10-05 DIAGNOSIS — M859 Disorder of bone density and structure, unspecified: Secondary | ICD-10-CM | POA: Diagnosis not present

## 2017-11-09 DIAGNOSIS — Z1231 Encounter for screening mammogram for malignant neoplasm of breast: Secondary | ICD-10-CM | POA: Diagnosis not present

## 2018-02-25 DIAGNOSIS — R1011 Right upper quadrant pain: Secondary | ICD-10-CM | POA: Diagnosis not present

## 2018-02-25 DIAGNOSIS — Z6823 Body mass index (BMI) 23.0-23.9, adult: Secondary | ICD-10-CM | POA: Diagnosis not present

## 2018-02-25 DIAGNOSIS — K219 Gastro-esophageal reflux disease without esophagitis: Secondary | ICD-10-CM | POA: Diagnosis not present

## 2018-03-01 DIAGNOSIS — Z0000 Encounter for general adult medical examination without abnormal findings: Secondary | ICD-10-CM | POA: Diagnosis not present

## 2018-03-01 DIAGNOSIS — M8580 Other specified disorders of bone density and structure, unspecified site: Secondary | ICD-10-CM | POA: Diagnosis not present

## 2018-03-01 DIAGNOSIS — R109 Unspecified abdominal pain: Secondary | ICD-10-CM | POA: Diagnosis not present

## 2018-03-03 DIAGNOSIS — A048 Other specified bacterial intestinal infections: Secondary | ICD-10-CM | POA: Diagnosis not present

## 2018-03-03 DIAGNOSIS — R1013 Epigastric pain: Secondary | ICD-10-CM | POA: Diagnosis not present

## 2018-03-08 DIAGNOSIS — Z0000 Encounter for general adult medical examination without abnormal findings: Secondary | ICD-10-CM | POA: Diagnosis not present

## 2018-03-08 DIAGNOSIS — I872 Venous insufficiency (chronic) (peripheral): Secondary | ICD-10-CM | POA: Diagnosis not present

## 2018-03-08 DIAGNOSIS — Z6823 Body mass index (BMI) 23.0-23.9, adult: Secondary | ICD-10-CM | POA: Diagnosis not present

## 2018-03-08 DIAGNOSIS — R05 Cough: Secondary | ICD-10-CM | POA: Diagnosis not present

## 2018-03-08 DIAGNOSIS — R5383 Other fatigue: Secondary | ICD-10-CM | POA: Diagnosis not present

## 2018-03-08 DIAGNOSIS — M5489 Other dorsalgia: Secondary | ICD-10-CM | POA: Diagnosis not present

## 2018-03-16 DIAGNOSIS — Z1212 Encounter for screening for malignant neoplasm of rectum: Secondary | ICD-10-CM | POA: Diagnosis not present

## 2018-09-03 DIAGNOSIS — H66009 Acute suppurative otitis media without spontaneous rupture of ear drum, unspecified ear: Secondary | ICD-10-CM | POA: Diagnosis not present

## 2018-09-03 DIAGNOSIS — R05 Cough: Secondary | ICD-10-CM | POA: Diagnosis not present

## 2018-09-03 DIAGNOSIS — J019 Acute sinusitis, unspecified: Secondary | ICD-10-CM | POA: Diagnosis not present

## 2018-09-03 DIAGNOSIS — Z6824 Body mass index (BMI) 24.0-24.9, adult: Secondary | ICD-10-CM | POA: Diagnosis not present

## 2018-09-21 DIAGNOSIS — H353131 Nonexudative age-related macular degeneration, bilateral, early dry stage: Secondary | ICD-10-CM | POA: Diagnosis not present

## 2018-09-21 DIAGNOSIS — H25013 Cortical age-related cataract, bilateral: Secondary | ICD-10-CM | POA: Diagnosis not present

## 2018-09-21 DIAGNOSIS — H2513 Age-related nuclear cataract, bilateral: Secondary | ICD-10-CM | POA: Diagnosis not present

## 2018-09-21 DIAGNOSIS — H04123 Dry eye syndrome of bilateral lacrimal glands: Secondary | ICD-10-CM | POA: Diagnosis not present

## 2018-10-28 DIAGNOSIS — H04123 Dry eye syndrome of bilateral lacrimal glands: Secondary | ICD-10-CM | POA: Diagnosis not present

## 2019-11-03 ENCOUNTER — Other Ambulatory Visit: Payer: Self-pay

## 2019-11-03 DIAGNOSIS — Z20822 Contact with and (suspected) exposure to covid-19: Secondary | ICD-10-CM

## 2019-11-05 ENCOUNTER — Telehealth: Payer: Self-pay

## 2019-11-05 LAB — NOVEL CORONAVIRUS, NAA: SARS-CoV-2, NAA: NOT DETECTED

## 2019-11-05 NOTE — Telephone Encounter (Signed)
Pt's husband called for covid results- advised results are not back.

## 2019-11-14 ENCOUNTER — Ambulatory Visit: Payer: Self-pay | Attending: Internal Medicine

## 2019-11-14 DIAGNOSIS — Z20822 Contact with and (suspected) exposure to covid-19: Secondary | ICD-10-CM

## 2019-11-15 LAB — NOVEL CORONAVIRUS, NAA: SARS-CoV-2, NAA: NOT DETECTED

## 2019-12-20 DIAGNOSIS — F4321 Adjustment disorder with depressed mood: Secondary | ICD-10-CM | POA: Diagnosis not present

## 2019-12-20 DIAGNOSIS — Z1331 Encounter for screening for depression: Secondary | ICD-10-CM | POA: Diagnosis not present

## 2019-12-20 DIAGNOSIS — F419 Anxiety disorder, unspecified: Secondary | ICD-10-CM | POA: Diagnosis not present

## 2020-01-30 ENCOUNTER — Ambulatory Visit: Payer: Self-pay | Attending: Internal Medicine

## 2020-01-30 DIAGNOSIS — Z23 Encounter for immunization: Secondary | ICD-10-CM | POA: Insufficient documentation

## 2020-01-30 NOTE — Progress Notes (Signed)
   Covid-19 Vaccination Clinic  Name:  Kathryn York    MRN: [id number] DOB: 04/04/1966  01/30/2020  Ms. Kathryn York was observed post Covid-19 immunization for 15 minutes without incident. She was provided with Vaccine Information Sheet and instruction to access the V-Safe system.   Ms. Kathryn York was instructed to call 911 with any severe reactions post vaccine: Marland Kitchen Difficulty breathing  . Swelling of face and throat  . A fast heartbeat  . A bad rash all over body  . Dizziness and weakness   Immunizations Administered    Name Date Dose VIS Date Route   Pfizer COVID-19 Vaccine 01/30/2020 10:36 AM 0.3 mL 11/04/2019 Intramuscular   Manufacturer: [place]   Lot: [id number]   [place]: [id number]

## 2020-02-22 ENCOUNTER — Ambulatory Visit: Payer: Self-pay

## 2020-02-28 ENCOUNTER — Ambulatory Visit: Payer: Self-pay | Attending: Internal Medicine

## 2020-02-28 DIAGNOSIS — Z23 Encounter for immunization: Secondary | ICD-10-CM

## 2020-02-28 NOTE — Progress Notes (Signed)
   Covid-19 Vaccination Clinic  Name:  Kathryn York    MRN: [id number] DOB: 1966/01/14  02/28/2020  Ms. Kathryn York was observed post Covid-19 immunization for 15 minutes without incident. She was provided with Vaccine Information Sheet and instruction to access the V-Safe system.   Ms. Kathryn York was instructed to call 911 with any severe reactions post vaccine: Marland Kitchen Difficulty breathing  . Swelling of face and throat  . A fast heartbeat  . A bad rash all over body  . Dizziness and weakness   Immunizations Administered    Name Date Dose VIS Date Route   Pfizer COVID-19 Vaccine 02/28/2020  3:05 PM 0.3 mL 11/04/2019 Intramuscular   Manufacturer: [place]   Lot: [id number]   [place]: [id number]

## 2020-05-15 DIAGNOSIS — K219 Gastro-esophageal reflux disease without esophagitis: Secondary | ICD-10-CM | POA: Diagnosis not present

## 2020-05-15 DIAGNOSIS — R6884 Jaw pain: Secondary | ICD-10-CM | POA: Diagnosis not present

## 2020-05-15 DIAGNOSIS — F4321 Adjustment disorder with depressed mood: Secondary | ICD-10-CM | POA: Diagnosis not present

## 2020-06-05 DIAGNOSIS — J301 Allergic rhinitis due to pollen: Secondary | ICD-10-CM | POA: Diagnosis not present

## 2020-06-05 DIAGNOSIS — H9202 Otalgia, left ear: Secondary | ICD-10-CM | POA: Diagnosis not present

## 2020-06-05 DIAGNOSIS — H6123 Impacted cerumen, bilateral: Secondary | ICD-10-CM | POA: Diagnosis not present

## 2020-07-25 DIAGNOSIS — Z20822 Contact with and (suspected) exposure to covid-19: Secondary | ICD-10-CM | POA: Diagnosis not present

## 2020-08-06 DIAGNOSIS — E559 Vitamin D deficiency, unspecified: Secondary | ICD-10-CM | POA: Diagnosis not present

## 2020-08-06 DIAGNOSIS — R7989 Other specified abnormal findings of blood chemistry: Secondary | ICD-10-CM | POA: Diagnosis not present

## 2020-08-06 DIAGNOSIS — Z0000 Encounter for general adult medical examination without abnormal findings: Secondary | ICD-10-CM | POA: Diagnosis not present

## 2020-08-20 DIAGNOSIS — E559 Vitamin D deficiency, unspecified: Secondary | ICD-10-CM | POA: Diagnosis not present

## 2020-08-20 DIAGNOSIS — Z0000 Encounter for general adult medical examination without abnormal findings: Secondary | ICD-10-CM | POA: Diagnosis not present

## 2020-08-21 DIAGNOSIS — Z1212 Encounter for screening for malignant neoplasm of rectum: Secondary | ICD-10-CM | POA: Diagnosis not present

## 2020-12-03 ENCOUNTER — Other Ambulatory Visit: Payer: Self-pay

## 2020-12-03 DIAGNOSIS — Z20822 Contact with and (suspected) exposure to covid-19: Secondary | ICD-10-CM

## 2020-12-04 LAB — NOVEL CORONAVIRUS, NAA: SARS-CoV-2, NAA: NOT DETECTED

## 2020-12-04 LAB — SARS-COV-2, NAA 2 DAY TAT

## 2020-12-07 DIAGNOSIS — R059 Cough, unspecified: Secondary | ICD-10-CM | POA: Diagnosis not present

## 2020-12-07 DIAGNOSIS — Z20828 Contact with and (suspected) exposure to other viral communicable diseases: Secondary | ICD-10-CM | POA: Diagnosis not present

## 2020-12-07 DIAGNOSIS — R509 Fever, unspecified: Secondary | ICD-10-CM | POA: Diagnosis not present

## 2020-12-07 DIAGNOSIS — B349 Viral infection, unspecified: Secondary | ICD-10-CM | POA: Diagnosis not present

## 2020-12-07 DIAGNOSIS — J029 Acute pharyngitis, unspecified: Secondary | ICD-10-CM | POA: Diagnosis not present

## 2020-12-31 DIAGNOSIS — M26632 Articular disc disorder of left temporomandibular joint: Secondary | ICD-10-CM | POA: Diagnosis not present

## 2020-12-31 DIAGNOSIS — M7911 Myalgia of mastication muscle: Secondary | ICD-10-CM | POA: Diagnosis not present

## 2020-12-31 DIAGNOSIS — G4763 Sleep related bruxism: Secondary | ICD-10-CM | POA: Diagnosis not present

## 2020-12-31 DIAGNOSIS — M7912 Myalgia of auxiliary muscles, head and neck: Secondary | ICD-10-CM | POA: Diagnosis not present

## 2021-01-04 ENCOUNTER — Other Ambulatory Visit: Payer: Self-pay | Admitting: Dentistry

## 2021-01-04 DIAGNOSIS — M1909 Primary osteoarthritis, other specified site: Secondary | ICD-10-CM

## 2021-01-04 DIAGNOSIS — M26632 Articular disc disorder of left temporomandibular joint: Secondary | ICD-10-CM

## 2021-01-24 ENCOUNTER — Ambulatory Visit
Admission: RE | Admit: 2021-01-24 | Discharge: 2021-01-24 | Disposition: A | Discharge status: Home / Self Care | Payer: BC Managed Care – PPO | Source: Ambulatory Visit | Attending: Dentistry | Admitting: Dentistry

## 2021-01-24 DIAGNOSIS — M2669 Other specified disorders of temporomandibular joint: Secondary | ICD-10-CM | POA: Diagnosis not present

## 2021-01-24 DIAGNOSIS — M26632 Articular disc disorder of left temporomandibular joint: Secondary | ICD-10-CM

## 2021-01-24 DIAGNOSIS — M1909 Primary osteoarthritis, other specified site: Secondary | ICD-10-CM

## 2021-01-31 ENCOUNTER — Other Ambulatory Visit: Payer: Self-pay

## 2021-02-04 ENCOUNTER — Other Ambulatory Visit: Payer: Self-pay

## 2021-03-14 DIAGNOSIS — D3132 Benign neoplasm of left choroid: Secondary | ICD-10-CM | POA: Diagnosis not present

## 2021-03-14 DIAGNOSIS — H2513 Age-related nuclear cataract, bilateral: Secondary | ICD-10-CM | POA: Diagnosis not present

## 2021-03-14 DIAGNOSIS — H25013 Cortical age-related cataract, bilateral: Secondary | ICD-10-CM | POA: Diagnosis not present

## 2021-03-14 DIAGNOSIS — H353131 Nonexudative age-related macular degeneration, bilateral, early dry stage: Secondary | ICD-10-CM | POA: Diagnosis not present

## 2021-03-14 DIAGNOSIS — H524 Presbyopia: Secondary | ICD-10-CM | POA: Diagnosis not present

## 2021-03-28 DIAGNOSIS — Z01419 Encounter for gynecological examination (general) (routine) without abnormal findings: Secondary | ICD-10-CM | POA: Diagnosis not present

## 2021-03-28 DIAGNOSIS — Z6823 Body mass index (BMI) 23.0-23.9, adult: Secondary | ICD-10-CM | POA: Diagnosis not present

## 2021-05-01 DIAGNOSIS — Z13820 Encounter for screening for osteoporosis: Secondary | ICD-10-CM | POA: Diagnosis not present

## 2021-05-01 DIAGNOSIS — Z1231 Encounter for screening mammogram for malignant neoplasm of breast: Secondary | ICD-10-CM | POA: Diagnosis not present

## 2021-05-03 ENCOUNTER — Other Ambulatory Visit: Payer: BC Managed Care – PPO

## 2021-07-16 DIAGNOSIS — M8580 Other specified disorders of bone density and structure, unspecified site: Secondary | ICD-10-CM | POA: Diagnosis not present

## 2021-11-12 DIAGNOSIS — J029 Acute pharyngitis, unspecified: Secondary | ICD-10-CM | POA: Diagnosis not present

## 2021-11-12 DIAGNOSIS — J0100 Acute maxillary sinusitis, unspecified: Secondary | ICD-10-CM | POA: Diagnosis not present

## 2022-02-03 DIAGNOSIS — R051 Acute cough: Secondary | ICD-10-CM | POA: Diagnosis not present

## 2022-02-03 DIAGNOSIS — J029 Acute pharyngitis, unspecified: Secondary | ICD-10-CM | POA: Diagnosis not present

## 2022-03-10 DIAGNOSIS — M25561 Pain in right knee: Secondary | ICD-10-CM | POA: Diagnosis not present

## 2022-03-10 DIAGNOSIS — M25562 Pain in left knee: Secondary | ICD-10-CM | POA: Diagnosis not present

## 2022-03-14 ENCOUNTER — Encounter (INDEPENDENT_AMBULATORY_CARE_PROVIDER_SITE_OTHER): Payer: Self-pay | Admitting: Ophthalmology

## 2022-03-24 ENCOUNTER — Encounter (INDEPENDENT_AMBULATORY_CARE_PROVIDER_SITE_OTHER): Payer: BC Managed Care – PPO | Admitting: Ophthalmology

## 2022-03-24 DIAGNOSIS — D3132 Benign neoplasm of left choroid: Secondary | ICD-10-CM

## 2022-03-24 DIAGNOSIS — H43813 Vitreous degeneration, bilateral: Secondary | ICD-10-CM | POA: Diagnosis not present

## 2022-03-24 DIAGNOSIS — H353131 Nonexudative age-related macular degeneration, bilateral, early dry stage: Secondary | ICD-10-CM

## 2022-03-24 DIAGNOSIS — H35371 Puckering of macula, right eye: Secondary | ICD-10-CM | POA: Diagnosis not present

## 2022-03-31 DIAGNOSIS — M25562 Pain in left knee: Secondary | ICD-10-CM | POA: Diagnosis not present

## 2022-04-07 DIAGNOSIS — S83242A Other tear of medial meniscus, current injury, left knee, initial encounter: Secondary | ICD-10-CM | POA: Diagnosis not present

## 2022-05-09 DIAGNOSIS — Z01419 Encounter for gynecological examination (general) (routine) without abnormal findings: Secondary | ICD-10-CM | POA: Diagnosis not present

## 2022-05-09 DIAGNOSIS — Z1231 Encounter for screening mammogram for malignant neoplasm of breast: Secondary | ICD-10-CM | POA: Diagnosis not present

## 2022-05-09 DIAGNOSIS — Z3A24 24 weeks gestation of pregnancy: Secondary | ICD-10-CM | POA: Diagnosis not present

## 2022-06-10 IMAGING — MR MR [PERSON_NAME]
11 series · 16 of 16 positions shown · non-contrast
Comparison: None.

CLINICAL DATA: Left jaw pain.  Pain extends into the neck

EXAM:
MRI OF TEMPOROMANDIBULAR JOINT WITHOUT CONTRAST
TECHNIQUE: Multiplanar, multisequence MR imaging of the temporomandibular joint
was performed following the standard protocol. No intravenous
contrast was administered.

[Series 5: T1 · axial · 4.0mm · 0.59mm/px · 1 of 17 slices shown]
[im 1/17]
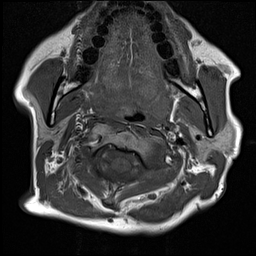

[Series 6: T2 fat-sat · sagittal · 4.0mm · 0.62mm/px · 1 of 11 slices shown (1 of 2)]
[im 1/11]
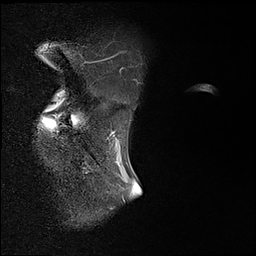

[Series 7: PD · sagittal · 4.0mm · 0.44mm/px · 1 of 11 slices shown (1 of 8)]
[im 1/11]
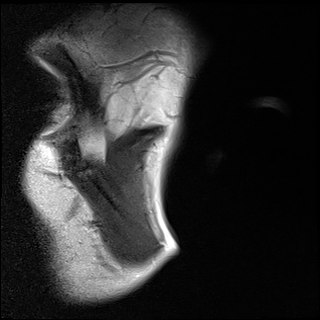

[Series 8: PD · coronal · 4.0mm · 0.44mm/px · 1 of 13 slices shown (2 of 8)]
[im 1/13]
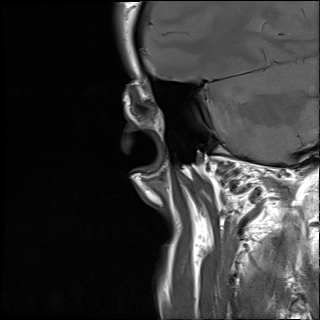

[Series 9: T2 fat-sat · sagittal · 4.0mm · 0.62mm/px · 1 of 11 slices shown (2 of 2)]
[im 1/11]
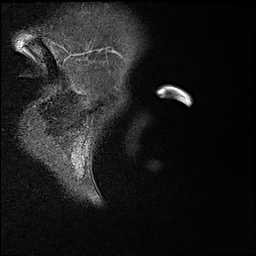

[Series 10: PD · sagittal · 4.0mm · 0.44mm/px · 1 of 9 slices shown (3 of 8)]
[im 1/9]
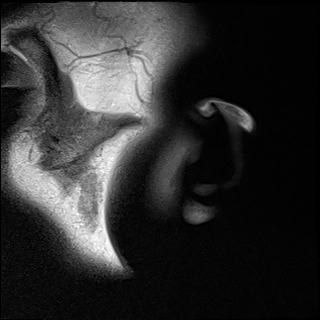

[Series 11: PD · coronal · 4.0mm · 0.44mm/px · 2 of 13 slices shown (4 of 8)]
[im 1/13]
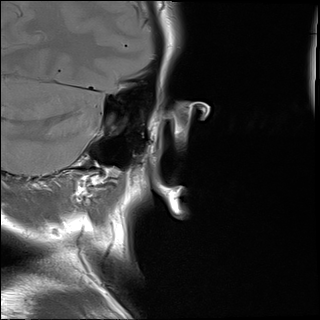
[im 13/13]
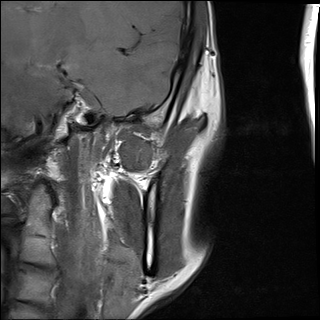

[Series 13: PD · sagittal · 4.0mm · 0.44mm/px · 2 of 13 slices shown (5 of 8)]
[im 1/13]
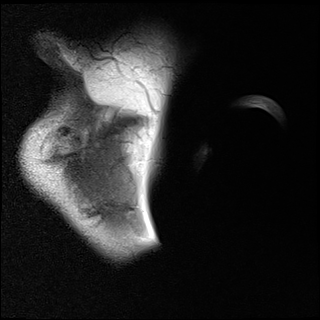
[im 13/13]
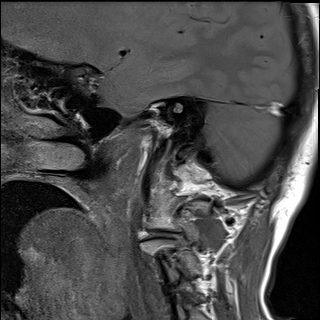

[Series 14: PD · coronal · 4.0mm · 0.44mm/px · 2 of 13 slices shown (6 of 8)]
[im 1/13]
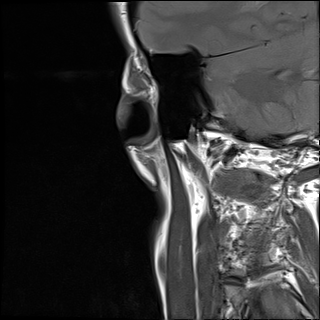
[im 13/13]
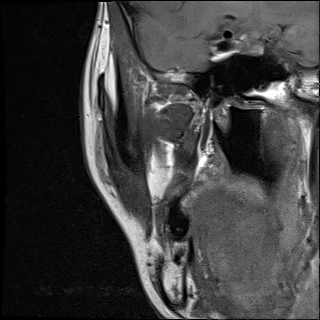

[Series 15: PD · sagittal · 4.0mm · 0.44mm/px · 2 of 13 slices shown (7 of 8)]
[im 1/13]
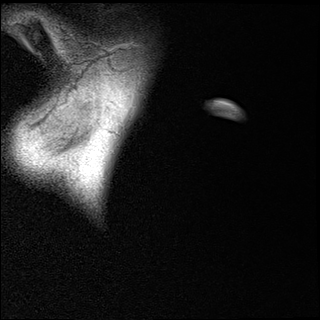
[im 13/13]
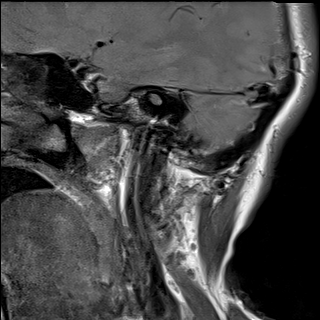

[Series 16: PD · coronal · 4.0mm · 0.44mm/px · 2 of 13 slices shown (8 of 8)]
[im 1/13]
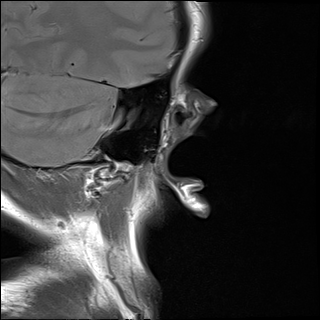
[im 13/13]
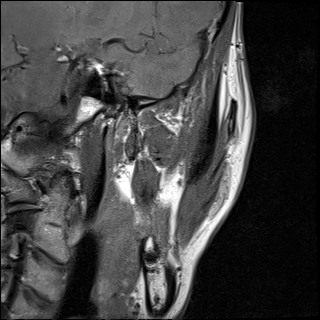

[16 of 16 positions shown; findings below may reference images not displayed]

FINDINGS: Right temporomandibular joint: The articular disc is normally
positioned between the mandibular condyle and the temporal bone in
both open and closed positions.There is normal anterior translation
of the mandibular condyle with jaw opening.There is no joint
effusion.

Left temporomandibular joint: The articular disc is position
anteriorly overlying the occipital eminence with maceration of the
posterior half of the articular disc. There is no significant
translation of the mandibular condyle with jaw opening. There is
severe synovitis. There is flattening of the mandibular condyle with
marginal osteophytes consistent with severe osteoarthritis.

Other: Visualized brain demonstrates no focal abnormality. No fluid
collection or hematoma.
IMPRESSION: 1. Severe osteoarthritis of the left temporomandibular joint with
maceration of the posterior half of the articular disc and severe
synovitis.

## 2022-08-25 DIAGNOSIS — E559 Vitamin D deficiency, unspecified: Secondary | ICD-10-CM | POA: Diagnosis not present

## 2022-08-25 DIAGNOSIS — F419 Anxiety disorder, unspecified: Secondary | ICD-10-CM | POA: Diagnosis not present

## 2022-08-25 DIAGNOSIS — R5383 Other fatigue: Secondary | ICD-10-CM | POA: Diagnosis not present

## 2022-08-25 DIAGNOSIS — Z1212 Encounter for screening for malignant neoplasm of rectum: Secondary | ICD-10-CM | POA: Diagnosis not present

## 2022-09-05 DIAGNOSIS — Z1331 Encounter for screening for depression: Secondary | ICD-10-CM | POA: Diagnosis not present

## 2022-09-05 DIAGNOSIS — Z0000 Encounter for general adult medical examination without abnormal findings: Secondary | ICD-10-CM | POA: Diagnosis not present

## 2022-09-05 DIAGNOSIS — M5489 Other dorsalgia: Secondary | ICD-10-CM | POA: Diagnosis not present

## 2022-09-05 DIAGNOSIS — R82998 Other abnormal findings in urine: Secondary | ICD-10-CM | POA: Diagnosis not present

## 2022-09-05 DIAGNOSIS — Z1339 Encounter for screening examination for other mental health and behavioral disorders: Secondary | ICD-10-CM | POA: Diagnosis not present

## 2022-12-03 ENCOUNTER — Ambulatory Visit (INDEPENDENT_AMBULATORY_CARE_PROVIDER_SITE_OTHER): Payer: BC Managed Care – PPO | Admitting: Dermatology

## 2022-12-03 ENCOUNTER — Encounter: Payer: Self-pay | Admitting: Dermatology

## 2022-12-03 VITALS — BP 127/81 | HR 75

## 2022-12-03 DIAGNOSIS — R21 Rash and other nonspecific skin eruption: Secondary | ICD-10-CM | POA: Diagnosis not present

## 2022-12-03 DIAGNOSIS — L603 Nail dystrophy: Secondary | ICD-10-CM

## 2022-12-03 MED ORDER — MOMETASONE FUROATE 0.1 % EX CREA: TOPICAL_CREAM | CUTANEOUS | 1 refills | Status: AC

## 2022-12-03 NOTE — Patient Instructions (Addendum)
Nails: Avoid SNS, gel or acrylic nails until resolved.   Start DermaNail strengthener/conditioner as directed on label.   Recommend taking: APPEAREX NAIL STRENGTHENING BIOTIN 2.5 MG  NuvailT (poly-ureaurethane, 16%) is a prescription that is not covered by insurance. Can see if available and more affordable in other countries.   Rash/Itching: Start Mometasone cream twice daily to affected areas as needed for rash/itching  Topical steroids (such as triamcinolone, fluocinolone, fluocinonide, mometasone, clobetasol, halobetasol, betamethasone, hydrocortisone) can cause thinning and lightening of the skin if they are used for too long in the same area. Your physician has selected the right strength medicine for your problem and area affected on the body. Please use your medication only as directed by your physician to prevent side effects.    Gentle Skin Care Guide  1. Bathe no more than once a day.  2. Avoid bathing in hot water  3. Use a mild soap like Dove, Vanicream, Cetaphil, CeraVe. Can use Lever 2000 or Cetaphil antibacterial soap  4. Use soap only where you need it. On most days, use it under your arms, between your legs, and on your feet. Let the water rinse other areas unless visibly dirty.  5. When you get out of the bath/shower, use a towel to gently blot your skin dry, don't rub it.  6. While your skin is still a little damp, apply a moisturizing cream such as Vanicream, CeraVe, Cetaphil, Eucerin, Aveeno, Sarna lotion or plain Vaseline Jelly. For hands apply Neutrogena [place] Hand Cream or Excipial Hand Cream.  7. Reapply moisturizer any time you start to itch or feel dry.  8. Sometimes using free and clear laundry detergents can be helpful. Fabric softener sheets should be avoided. Downy Free & Gentle liquid, or any liquid fabric softener that is free of dyes and perfumes, it acceptable to use  9. If your doctor has given you prescription creams you may apply  moisturizers over them    Recommend OTC [place] Rapid Relief Anti-Itch cream (pramoxine + menthol), CeraVe Anti-itch cream or lotion (pramoxine), Sarna lotion (Original- menthol + camphor or Sensitive- pramoxine) or Eucerin 12 hour Itch Relief lotion (menthol) up to 3 times per day to areas on body that are itchy.    Due to recent changes in healthcare laws, you may see results of your pathology and/or laboratory studies on MyChart before the doctors have had a chance to review them. We understand that in some cases there may be results that are confusing or concerning to you. Please understand that not all results are received at the same time and often the doctors may need to interpret multiple results in order to provide you with the best plan of care or course of treatment. Therefore, we ask that you please give Kathryn York 2 business days to thoroughly review all your results before contacting the office for clarification. Should we see a critical lab result, you will be contacted sooner.   If You Need Anything After Your Visit  If you have any questions or concerns for your doctor, please call our main line at [phone] and press option 4 to reach your doctor's medical assistant. If no one answers, please leave a voicemail as directed and we will return your call as soon as possible. Messages left after 4 pm will be answered the following business day.   You may also send Kathryn York a message via [place]. We typically respond to MyChart messages within 1-2 business days.  For prescription refills, please ask your  pharmacy to contact our office. Our fax number is [phone].  If you have an urgent issue when the clinic is closed that cannot wait until the next business day, you can page your doctor at the number below.    Please note that while we do our best to be available for urgent issues outside of office hours, we are not available 24/7.   If you have an urgent issue and are unable to reach  Kathryn York, you may choose to seek medical care at your doctor's office, retail clinic, urgent care center, or emergency room.  If you have a medical emergency, please immediately call 911 or go to the emergency department.  Pager Numbers  - Dr. Nehemiah Massed: [phone]  - Dr. Laurence Ferrari: [phone]  - Dr. Nicole Kindred: [phone]  In the event of inclement weather, please call our main line at [phone] for an update on the status of any delays or closures.  Dermatology Medication Tips: Please keep the boxes that topical medications come in in order to help keep track of the instructions about where and how to use these. Pharmacies typically print the medication instructions only on the boxes and not directly on the medication tubes.   If your medication is too expensive, please contact our office at [phone] option 4 or send Kathryn York a message through [place].   We are unable to tell what your co-pay for medications will be in advance as this is different depending on your insurance coverage. However, we may be able to find a substitute medication at lower cost or fill out paperwork to get insurance to cover a needed medication.   If a prior authorization is required to get your medication covered by your insurance company, please allow Kathryn York 1-2 business days to complete this process.  Drug prices often vary depending on where the prescription is filled and some pharmacies may offer cheaper prices.  The website www.goodrx.com contains coupons for medications through different pharmacies. The prices here do not account for what the cost may be with help from insurance (it may be cheaper with your insurance), but the website can give you the price if you did not use any insurance.  - You can print the associated coupon and take it with your prescription to the pharmacy.  - You may also stop by our office during regular business hours and pick up a GoodRx coupon card.  - If you need your prescription sent  electronically to a different pharmacy, notify our office through [facility] or by phone at [phone] option 4.     Si Usted Necesita Algo Despus de Su Visita  Tambin puede enviarnos un mensaje a travs de Pharmacist, community. Por lo general respondemos a los mensajes de MyChart en el transcurso de 1 a 2 das hbiles.  Para renovar recetas, por favor pida a su farmacia que se ponga en contacto con nuestra oficina. Harland Dingwall de fax es [place] [phone].  Si tiene un asunto urgente cuando la clnica est cerrada y que no puede esperar hasta el siguiente da hbil, puede llamar/localizar a su doctor(a) al nmero que aparece a continuacin.   Por favor, tenga en cuenta que aunque hacemos todo lo posible para estar disponibles para asuntos urgentes fuera del horario de [place], no estamos disponibles las 24 horas del da, los 7 das de la [place].   Si tiene un problema urgente y no puede comunicarse con nosotros, puede optar por buscar atencin mdica  en el consultorio de su doctor(a), en  una clnica privada, en un centro de atencin urgente o en una sala de emergencias.  Si tiene Engineering geologist, por favor llame inmediatamente al 911 o vaya a la sala de emergencias.  Nmeros de bper  - Dr. Nehemiah Massed: [phone]  - Dra. Moye: [phone]  - Dra. Nicole Kindred: [phone]  En caso de inclemencias del [place], por favor llame a Johnsie Kindred principal al [phone] para una actualizacin sobre el [place] de cualquier retraso o cierre.  Consejos para la medicacin en dermatologa: Por favor, guarde las cajas en las que vienen los medicamentos de uso tpico para ayudarle a seguir las instrucciones sobre dnde y cmo usarlos. Las farmacias generalmente imprimen las instrucciones del medicamento slo en las cajas y no directamente en los tubos del [place].   Si su medicamento es muy caro, por favor, pngase en contacto con Zigmund Daniel llamando al [phone] y presione la  opcin 4 o envenos un mensaje a travs de Pharmacist, community.   No podemos decirle cul ser su copago por los medicamentos por adelantado ya que esto es diferente dependiendo de la cobertura de su seguro. Sin embargo, es posible que podamos encontrar un medicamento sustituto a Electrical engineer un formulario para que el seguro cubra el medicamento que se considera necesario.   Si se requiere una autorizacin previa para que su compaa de seguros [place] su medicamento, por favor permtanos de 1 a 2 das hbiles para completar este proceso.  Los precios de los medicamentos varan con frecuencia dependiendo del Environmental consultant de dnde se surte la receta y alguna farmacias pueden ofrecer precios ms baratos.  El sitio web www.goodrx.com tiene cupones para medicamentos de Airline pilot. Los precios aqu no tienen en cuenta lo que podra costar con la ayuda del seguro (puede ser ms barato con su seguro), pero el sitio web puede darle el precio si no utiliz Research scientist (physical sciences).  - Puede imprimir el cupn correspondiente y llevarlo con su receta a la farmacia.  - Tambin puede pasar por nuestra oficina durante el horario de atencin regular y Charity fundraiser una tarjeta de cupones de GoodRx.  - Si necesita que su receta se enve electrnicamente a una farmacia diferente, informe a nuestra oficina a travs de MyChart de [place] o por telfono llamando al [phone] y presione la opcin 4.

## 2022-12-03 NOTE — Progress Notes (Signed)
   New Patient Visit  Subjective  Kathryn York is a 57 y.o. female who presents for the following: Nail Problem (L-4 nail. Separated from the cuticle 1-2 months ago. Is now tender. Used to get SNS nails at salon. Denies trauma to nail) and Rash (Had rash on arms and chest 1 month ago. Hands were clear. Was itching and bleeding. Is better now since waiting for this appointment. Was sick in November, took Amoxicillin, this rash happened after ).    Review of Systems: No other skin or systemic complaints except as noted in HPI or Assessment and Plan.   Objective  Well appearing patient in no apparent distress; mood and affect are within normal limits.  A focused examination was performed including upper extremities, including the arms, hands, fingers, and fingernails and chest. Relevant physical exam findings are noted in the Assessment and Plan.  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 Avoid SNS, gel or acrylic nails until resolved.   Start DermaNail strengthener/conditioner as directed on label. Bid to cuticle  Recommend taking: APPEAREX NAIL STRENGTHENING BIOTIN 2.5 MG PO qd  NuvailT (poly-ureaurethane, 16%) is a prescription that is not covered by insurance that may be helpful, apply nightly to nail plate.  Will take several months for nail to completely grow out  Rash Chest, arms  Resolving but with residual pruritus, possible viral exanthem since pt had viral illness few days prior to onset of rash.   Start Mometasone cream twice daily to affected areas as needed for rash/itching  Topical steroids (such as triamcinolone, fluocinolone, fluocinonide, mometasone, clobetasol, halobetasol, betamethasone,  hydrocortisone) can cause thinning and lightening of the skin if they are used for too long in the same area. Your physician has selected the right strength medicine for your problem and area affected on the body. Please use your medication only as directed by your physician to prevent side effects.    mometasone (ELOCON) 0.1 % cream - Chest, arms Apply twice daily to affected body areas as needed for rash/itching   Return in about 3 months (around 03/04/2023) for Nail follow up.  I, Emelia Salisbury, CMA, am acting as scribe for Brendolyn Patty, MD.  Documentation: I have reviewed the above documentation for accuracy and completeness, and I agree with the above.  Brendolyn Patty MD

## 2023-03-10 ENCOUNTER — Ambulatory Visit: Payer: BC Managed Care – PPO | Admitting: Dermatology

## 2023-04-16 DIAGNOSIS — N393 Stress incontinence (female) (male): Secondary | ICD-10-CM | POA: Diagnosis not present

## 2023-05-05 ENCOUNTER — Ambulatory Visit (INDEPENDENT_AMBULATORY_CARE_PROVIDER_SITE_OTHER): Payer: BC Managed Care – PPO | Admitting: Podiatry

## 2023-05-05 DIAGNOSIS — L84 Corns and callosities: Secondary | ICD-10-CM | POA: Diagnosis not present

## 2023-05-05 NOTE — Progress Notes (Signed)
Subjective:  Patient ID: Kathryn York, female    DOB: 1966-03-16,  MRN: [id number]  Chief Complaint  Patient presents with   Toe Pain    Right foot 5th toe pain place in between toe    57 y.o. female presents with the above complaint.  Patient presents with right fourth and fifth digit heloma molle.  Patient states pain for touch painful to walk a lot she went to get it evaluated.  She has not seen anyone else prior to seeing me denies any other acute complaints.  Pain scale is 5 out of 10 dull achy in nature.  Started to have issues heart more   Review of Systems: Negative except as noted in the HPI. Denies N/V/F/Ch.  Past Medical History:  Diagnosis Date   Allergy    GERD (gastroesophageal reflux disease)    with LRP sx, s/p EGD by [facility]   History of chicken pox    Scoliosis     Current Outpatient Medications:    azithromycin (ZITHROMAX) 250 MG tablet, Take 250 mg by mouth daily., Disp: , Rfl:    benzonatate (TESSALON PERLES) 100 MG capsule, Take 1 capsule (100 mg total) by mouth every 6 (six) hours as needed for cough., Disp: 15 capsule, Rfl: 0   guaiFENesin (MUCINEX) 600 MG 12 hr tablet, Take 600 mg by mouth 2 (two) times daily as needed., Disp: , Rfl:    lansoprazole (PREVACID) 15 MG capsule, Take 15 mg by mouth daily., Disp: , Rfl:    mometasone (ELOCON) 0.1 % cream, Apply twice daily to affected body areas as needed for rash/itching, Disp: 45 g, Rfl: 1   predniSONE (DELTASONE) 5 MG tablet, Take 5 mg by mouth daily with breakfast. Take six tablets on day 1, Taper down by one tablet for 6 days., Disp: , Rfl:    promethazine-codeine (PHENERGAN WITH CODEINE) 6.25-10 MG/5ML syrup, Take 5 mLs by mouth every 6 (six) hours as needed for cough., Disp: , Rfl:   Social History   Tobacco Use  Smoking Status Former   Packs/day: 0.80   Years: 15.00   Additional pack years: 0.00   Total pack years: 12.00   Types: Cigarettes   Quit date: 11/24/1993   Years since quitting:  29.4  Smokeless Tobacco Never    Allergies  Allergen Reactions   Amitriptyline Other (See Comments)    Caused BP to drop and pt felt very weak.   Objective:  There were no vitals filed for this visit. There is no height or weight on file to calculate BMI. Constitutional Well developed. Well nourished.  Vascular Dorsalis pedis pulses palpable bilaterally. Posterior tibial pulses palpable bilaterally. Capillary refill normal to all digits.  No cyanosis or clubbing noted. Pedal hair growth normal.  Neurologic Normal speech. Oriented to person, place, and time. Epicritic sensation to light touch grossly present bilaterally.  Dermatologic Right fourth and fifth digit heloma molle with hyperkeratotic lesion in the interdigital space pain on palpation to the lesion. Hammertoe contracture noted of the fifth and the fourth digit  Orthopedic: Normal joint ROM without pain or crepitus bilaterally. No visible deformities. No bony tenderness.   Radiographs: None Assessment:   1. Heloma molle    Plan:  Patient was evaluated and treated and all questions answered.  Right fourth and fifth digit heloma molle -All questions and concerns were discussed with the patient in extensive detail given the amount of pain that she is experiencing she will benefit from shoe gear modification.  Also discussed and dispensed toe spacers and protectors. -If there is no improvement patient will benefit from surgical correction  No follow-ups on file.

## 2023-08-03 DIAGNOSIS — H2513 Age-related nuclear cataract, bilateral: Secondary | ICD-10-CM | POA: Diagnosis not present

## 2023-08-03 DIAGNOSIS — H04123 Dry eye syndrome of bilateral lacrimal glands: Secondary | ICD-10-CM | POA: Diagnosis not present

## 2023-08-11 DIAGNOSIS — Z13820 Encounter for screening for osteoporosis: Secondary | ICD-10-CM | POA: Diagnosis not present

## 2023-08-11 DIAGNOSIS — Z1231 Encounter for screening mammogram for malignant neoplasm of breast: Secondary | ICD-10-CM | POA: Diagnosis not present

## 2023-08-11 DIAGNOSIS — Z6833 Body mass index (BMI) 33.0-33.9, adult: Secondary | ICD-10-CM | POA: Diagnosis not present

## 2023-08-11 DIAGNOSIS — Z01419 Encounter for gynecological examination (general) (routine) without abnormal findings: Secondary | ICD-10-CM | POA: Diagnosis not present

## 2023-08-21 DIAGNOSIS — M8580 Other specified disorders of bone density and structure, unspecified site: Secondary | ICD-10-CM | POA: Diagnosis not present

## 2023-08-27 ENCOUNTER — Ambulatory Visit: Payer: BC Managed Care – PPO | Admitting: Podiatry

## 2023-09-15 DIAGNOSIS — Z0189 Encounter for other specified special examinations: Secondary | ICD-10-CM | POA: Diagnosis not present

## 2023-09-15 DIAGNOSIS — Z0001 Encounter for general adult medical examination with abnormal findings: Secondary | ICD-10-CM | POA: Diagnosis not present

## 2023-09-15 DIAGNOSIS — M8580 Other specified disorders of bone density and structure, unspecified site: Secondary | ICD-10-CM | POA: Diagnosis not present

## 2023-09-15 DIAGNOSIS — E559 Vitamin D deficiency, unspecified: Secondary | ICD-10-CM | POA: Diagnosis not present

## 2023-09-22 DIAGNOSIS — Z0000 Encounter for general adult medical examination without abnormal findings: Secondary | ICD-10-CM | POA: Diagnosis not present

## 2023-09-22 DIAGNOSIS — R82998 Other abnormal findings in urine: Secondary | ICD-10-CM | POA: Diagnosis not present

## 2023-09-22 DIAGNOSIS — M5489 Other dorsalgia: Secondary | ICD-10-CM | POA: Diagnosis not present

## 2024-03-24 ENCOUNTER — Encounter (INDEPENDENT_AMBULATORY_CARE_PROVIDER_SITE_OTHER): Payer: BC Managed Care – PPO | Admitting: Ophthalmology

## 2024-03-24 DIAGNOSIS — D3132 Benign neoplasm of left choroid: Secondary | ICD-10-CM | POA: Diagnosis not present

## 2024-03-24 DIAGNOSIS — H353131 Nonexudative age-related macular degeneration, bilateral, early dry stage: Secondary | ICD-10-CM | POA: Diagnosis not present

## 2024-03-24 DIAGNOSIS — H2513 Age-related nuclear cataract, bilateral: Secondary | ICD-10-CM | POA: Diagnosis not present

## 2024-03-24 DIAGNOSIS — H43813 Vitreous degeneration, bilateral: Secondary | ICD-10-CM

## 2024-09-22 DIAGNOSIS — Z1212 Encounter for screening for malignant neoplasm of rectum: Secondary | ICD-10-CM | POA: Diagnosis not present

## 2024-09-22 DIAGNOSIS — Z0000 Encounter for general adult medical examination without abnormal findings: Secondary | ICD-10-CM | POA: Diagnosis not present

## 2024-09-22 DIAGNOSIS — R7989 Other specified abnormal findings of blood chemistry: Secondary | ICD-10-CM | POA: Diagnosis not present

## 2024-09-29 DIAGNOSIS — R82998 Other abnormal findings in urine: Secondary | ICD-10-CM | POA: Diagnosis not present

## 2024-09-29 DIAGNOSIS — Z0000 Encounter for general adult medical examination without abnormal findings: Secondary | ICD-10-CM | POA: Diagnosis not present

## 2024-10-24 DIAGNOSIS — H40013 Open angle with borderline findings, low risk, bilateral: Secondary | ICD-10-CM | POA: Diagnosis not present

## 2024-10-24 DIAGNOSIS — H25813 Combined forms of age-related cataract, bilateral: Secondary | ICD-10-CM | POA: Diagnosis not present

## 2024-10-24 DIAGNOSIS — D3132 Benign neoplasm of left choroid: Secondary | ICD-10-CM | POA: Diagnosis not present

## 2024-10-24 DIAGNOSIS — H353131 Nonexudative age-related macular degeneration, bilateral, early dry stage: Secondary | ICD-10-CM | POA: Diagnosis not present

## 2024-11-01 DIAGNOSIS — Z6824 Body mass index (BMI) 24.0-24.9, adult: Secondary | ICD-10-CM | POA: Diagnosis not present

## 2024-11-01 DIAGNOSIS — Z1151 Encounter for screening for human papillomavirus (HPV): Secondary | ICD-10-CM | POA: Diagnosis not present

## 2024-11-01 DIAGNOSIS — Z01419 Encounter for gynecological examination (general) (routine) without abnormal findings: Secondary | ICD-10-CM | POA: Diagnosis not present

## 2024-11-01 DIAGNOSIS — Z124 Encounter for screening for malignant neoplasm of cervix: Secondary | ICD-10-CM | POA: Diagnosis not present

## 2024-11-01 DIAGNOSIS — Z1231 Encounter for screening mammogram for malignant neoplasm of breast: Secondary | ICD-10-CM | POA: Diagnosis not present

## 2024-12-01 ENCOUNTER — Other Ambulatory Visit: Payer: Self-pay | Admitting: Internal Medicine

## 2024-12-01 DIAGNOSIS — R110 Nausea: Secondary | ICD-10-CM

## 2024-12-15 ENCOUNTER — Ambulatory Visit
Admission: RE | Admit: 2024-12-15 | Discharge: 2024-12-15 | Disposition: A | Discharge status: Home / Self Care | Source: Ambulatory Visit | Attending: Internal Medicine | Admitting: Internal Medicine

## 2024-12-15 DIAGNOSIS — R110 Nausea: Secondary | ICD-10-CM

## 2025-03-23 ENCOUNTER — Encounter (INDEPENDENT_AMBULATORY_CARE_PROVIDER_SITE_OTHER): Admitting: Ophthalmology
